# Patient Record
Sex: Female | Born: 1970 | Marital: Married | Smoking: Never smoker
Health system: Southern US, Community
[De-identification: ages and names within clinical notes are randomized; demographics above are authoritative.]

## PROBLEM LIST (undated history)

## (undated) DIAGNOSIS — Z9889 Other specified postprocedural states: Secondary | ICD-10-CM

## (undated) DIAGNOSIS — R87629 Unspecified abnormal cytological findings in specimens from vagina: Secondary | ICD-10-CM

## (undated) HISTORY — DX: Other specified postprocedural states: Z98.890

## (undated) HISTORY — DX: Unspecified abnormal cytological findings in specimens from vagina: R87.629

---

## 2019-12-06 ENCOUNTER — Encounter: Payer: Self-pay | Admitting: Obstetrics and Gynecology

## 2019-12-06 ENCOUNTER — Other Ambulatory Visit (HOSPITAL_COMMUNITY)
Admission: RE | Admit: 2019-12-06 | Discharge: 2019-12-06 | Disposition: A | Discharge status: Home / Self Care | Payer: 59 | Source: Ambulatory Visit | Attending: Obstetrics and Gynecology | Admitting: Obstetrics and Gynecology

## 2019-12-06 ENCOUNTER — Other Ambulatory Visit: Payer: Self-pay

## 2019-12-06 ENCOUNTER — Ambulatory Visit (INDEPENDENT_AMBULATORY_CARE_PROVIDER_SITE_OTHER): Payer: Self-pay | Admitting: Obstetrics and Gynecology

## 2019-12-06 VITALS — BP 109/55 | HR 66 | Ht 60.0 in | Wt 92.0 lb

## 2019-12-06 DIAGNOSIS — Z113 Encounter for screening for infections with a predominantly sexual mode of transmission: Secondary | ICD-10-CM

## 2019-12-06 DIAGNOSIS — R634 Abnormal weight loss: Secondary | ICD-10-CM

## 2019-12-06 DIAGNOSIS — Z1231 Encounter for screening mammogram for malignant neoplasm of breast: Secondary | ICD-10-CM

## 2019-12-06 DIAGNOSIS — Z124 Encounter for screening for malignant neoplasm of cervix: Secondary | ICD-10-CM | POA: Insufficient documentation

## 2019-12-06 DIAGNOSIS — Z01419 Encounter for gynecological examination (general) (routine) without abnormal findings: Secondary | ICD-10-CM

## 2019-12-06 DIAGNOSIS — R197 Diarrhea, unspecified: Secondary | ICD-10-CM

## 2019-12-06 NOTE — Progress Notes (Signed)
GYNECOLOGY ANNUAL PREVENTATIVE CARE ENCOUNTER NOTE  Subjective:   Mikayla Short is a 49 y.o. [phone] female here for a annual gynecologic exam. Current complaints: weight loss of 10 pounds unintentionally. She also has watery diarrhea and is very worried. Last Saw GYN in [place] and just wants a full check up.    Denies abnormal vaginal bleeding, discharge, pelvic pain, problems with intercourse or other gynecologic concerns. Accepts STI screen for swabs, does not want blood work.   Gynecologic History Patient's last menstrual period was 11/24/2019 (exact date). Contraception: perimenopausal Last Pap: many years ago, normal per pt Last mammogram: many years ago, normal per pt DEXA: has never had  Obstetric History OB History  Gravida Para Term Preterm AB Living  4 2 2  0 2 2  SAB TAB Ectopic Multiple Live Births  2 0 0 0 2    # Outcome Date GA Lbr Len/2nd Weight Sex Delivery Anes PTL Lv  4 SAB           3 SAB           2 Term      Vag-Spont     1 Term      CS-LTranv       Past Medical History:  Diagnosis Date  . Vaginal Pap smear, abnormal     History reviewed. No pertinent surgical history.  No current outpatient medications on file prior to visit.   No current facility-administered medications on file prior to visit.    No Known Allergies  Social History   Socioeconomic History  . Marital status: Married    Spouse name: Not on file  . Number of children: 2  . Years of education: Not on file  . Highest education level: Not on file  Occupational History  . Not on file  Tobacco Use  . Smoking status: Never Smoker  . Smokeless tobacco: Never Used  Vaping Use  . Vaping Use: Never used  Substance and Sexual Activity  . Alcohol use: Not Currently  . Drug use: Never  . Sexual activity: Yes  Other Topics Concern  . Not on file  Social History Narrative  . Not on file   Social Determinants of Health   Financial Resource Strain:   . Difficulty of  Paying Living Expenses: Not on file  Food Insecurity:   . Worried About in the Last Year: Not on file  . Ran Out of Food in the Last Year: Not on file  Transportation Needs:   . Lack of Transportation (Medical): Not on file  . Lack of Transportation (Non-Medical): Not on file  Physical Activity:   . Days of Exercise per Week: Not on file  . Minutes of Exercise per Session: Not on file  Stress:   . Feeling of Stress : Not on file  Social Connections:   . Frequency of Communication with Friends and Family: Not on file  . Frequency of Social Gatherings with Friends and Family: Not on file  . Attends Religious Services: Not on file  . Active Member of Clubs or Organizations: Not on file  . Attends Programme researcher, broadcasting/film/video Meetings: Not on file  . Marital Status: Not on file  Intimate Partner Violence:   . Fear of Current or Ex-Partner: Not on file  . Emotionally Abused: Not on file  . Physically Abused: Not on file  . Sexually Abused: Not on file    Family History  Problem Relation Age of  Onset  . Diabetes Mother   . Cancer Maternal Aunt   . Colon cancer Neg Hx   . Hypertension Neg Hx    The following portions of the patient's history were reviewed and updated as appropriate: allergies, current medications, past family history, past medical history, past social history, past surgical history and problem list.  Review of Systems Pertinent items are noted in HPI.   Objective:  BP (!) 109/55   Pulse 66   Ht 5' (1.524 m)   Wt 92 lb (41.7 kg)   LMP 11/24/2019 (Exact Date)   BMI 17.97 kg/m  CONSTITUTIONAL: Well-developed, well-nourished female in no acute distress.  HENT:  Normocephalic, atraumatic, External right and left ear normal. Oropharynx is clear and moist EYES: Conjunctivae and EOM are normal. Pupils are equal, round, and reactive to light. No scleral icterus.  NECK: Normal range of motion, supple, no masses.  Normal thyroid.  SKIN: Skin is warm and  dry. No rash noted. Not diaphoretic. No erythema. No pallor. NEUROLOGIC: Alert and oriented to person, place, and time. Normal reflexes, muscle tone coordination. No cranial nerve deficit noted. PSYCHIATRIC: Normal mood and affect. Normal behavior. Normal judgment and thought content. CARDIOVASCULAR: Normal heart rate noted RESPIRATORY: Effort normal, no problems with respiration noted. BREASTS: Symmetric in size. No masses, skin changes, nipple drainage, or lymphadenopathy. ABDOMEN: Soft, no distention noted.  No tenderness, rebound or guarding.  PELVIC: Normal appearing external genitalia; normal appearing vaginal mucosa and cervix.  No abnormal discharge noted.  Pap smear obtained. Pelvic cultures obtained. Normal uterine size, no other palpable masses, no uterine or adnexal tenderness. MUSCULOSKELETAL: Normal range of motion. No tenderness.  No cyanosis, clubbing, or edema.  2+ distal pulses.  Exam done with chaperone present.   Assessment and Plan:   1. Well woman exam Healthy female exam Reassured her regarding irregular periods, perimenopausal  2. Cervical cancer screening Pap today  3. Routine screening for STI (sexually transmitted infection) Gc/CT today Declines HIV/Hep B/C/RPR  4. Encounter for screening mammogram for malignant neoplasm of breast mammo ordered today  5. Weight loss Referral to GI  6. Diarrhea, unspecified type    Will follow up results of pap smear/STI screen and manage accordingly. Encouraged improvement in diet and exercise.  COVID vaccine UTD Accepts STI screen. Mammogram ordered today Referral for colonoscopy today  Routine preventative health maintenance measures emphasized. Please refer to After Visit Summary for other counseling recommendations.   Total face-to-face time with patient: 30 minutes. Over 50% of encounter was spent on counseling and coordination of care.   Baldemar Lenis, M.D. [facility]ending [facility] [employer]  Midwife)

## 2019-12-06 NOTE — Progress Notes (Signed)
Patient states she is worried that she has lost weight (10+ lbs)

## 2019-12-06 NOTE — Addendum Note (Signed)
Addended by: Leroy Libman on: 12/06/2019 10:43 AM   Modules accepted: Orders

## 2019-12-08 ENCOUNTER — Encounter: Payer: Self-pay | Admitting: Nurse Practitioner

## 2019-12-11 LAB — CYTOLOGY - PAP
Chlamydia: NEGATIVE
Comment: NEGATIVE
Comment: NEGATIVE
Comment: NORMAL
Diagnosis: NEGATIVE
High risk HPV: NEGATIVE
Neisseria Gonorrhea: NEGATIVE

## 2019-12-20 ENCOUNTER — Other Ambulatory Visit: Payer: Self-pay | Admitting: Gastroenterology

## 2019-12-20 DIAGNOSIS — R634 Abnormal weight loss: Secondary | ICD-10-CM

## 2019-12-20 DIAGNOSIS — R1011 Right upper quadrant pain: Secondary | ICD-10-CM

## 2019-12-21 ENCOUNTER — Ambulatory Visit: Payer: 59 | Admitting: Physician Assistant

## 2019-12-26 ENCOUNTER — Ambulatory Visit: Payer: 59 | Admitting: Nurse Practitioner

## 2020-01-08 ENCOUNTER — Other Ambulatory Visit: Payer: Self-pay | Admitting: Physician Assistant

## 2020-01-08 DIAGNOSIS — Z0001 Encounter for general adult medical examination with abnormal findings: Secondary | ICD-10-CM

## 2020-01-17 ENCOUNTER — Ambulatory Visit: Payer: Self-pay

## 2020-01-19 ENCOUNTER — Ambulatory Visit
Admission: RE | Admit: 2020-01-19 | Discharge: 2020-01-19 | Disposition: A | Discharge status: Home / Self Care | Payer: 59 | Source: Ambulatory Visit | Attending: Gastroenterology | Admitting: Gastroenterology

## 2020-01-19 ENCOUNTER — Other Ambulatory Visit: Payer: Self-pay

## 2020-01-19 DIAGNOSIS — R634 Abnormal weight loss: Secondary | ICD-10-CM

## 2020-01-19 DIAGNOSIS — R1011 Right upper quadrant pain: Secondary | ICD-10-CM

## 2020-01-19 MED ORDER — IOPAMIDOL (ISOVUE-300) INJECTION 61%
100.0000 mL | Freq: Once | INTRAVENOUS | Status: AC | PRN
  Administered 2020-01-19: 09:00:00 100 mL via INTRAVENOUS

## 2020-03-01 ENCOUNTER — Other Ambulatory Visit: Payer: Self-pay

## 2020-03-01 ENCOUNTER — Ambulatory Visit
Admission: RE | Admit: 2020-03-01 | Discharge: 2020-03-01 | Disposition: A | Discharge status: Home / Self Care | Payer: 59 | Source: Ambulatory Visit | Attending: Obstetrics and Gynecology | Admitting: Obstetrics and Gynecology

## 2020-03-01 DIAGNOSIS — Z1231 Encounter for screening mammogram for malignant neoplasm of breast: Secondary | ICD-10-CM

## 2020-04-19 ENCOUNTER — Other Ambulatory Visit: Payer: 59

## 2020-09-24 ENCOUNTER — Other Ambulatory Visit: Payer: Self-pay | Admitting: Physician Assistant

## 2020-09-24 DIAGNOSIS — Z0001 Encounter for general adult medical examination with abnormal findings: Secondary | ICD-10-CM

## 2020-09-25 ENCOUNTER — Other Ambulatory Visit: Payer: 59

## 2021-03-14 ENCOUNTER — Other Ambulatory Visit: Payer: 59

## 2021-05-05 ENCOUNTER — Other Ambulatory Visit: Payer: Self-pay

## 2021-05-05 ENCOUNTER — Other Ambulatory Visit: Payer: Self-pay | Admitting: Physician Assistant

## 2021-05-05 DIAGNOSIS — Z0001 Encounter for general adult medical examination with abnormal findings: Secondary | ICD-10-CM

## 2021-05-05 DIAGNOSIS — Z1231 Encounter for screening mammogram for malignant neoplasm of breast: Secondary | ICD-10-CM

## 2021-05-15 ENCOUNTER — Ambulatory Visit: Payer: Self-pay

## 2021-06-06 ENCOUNTER — Ambulatory Visit: Payer: Self-pay | Admitting: Family Medicine

## 2021-06-20 ENCOUNTER — Ambulatory Visit: Payer: Self-pay | Admitting: Family

## 2021-06-27 ENCOUNTER — Ambulatory Visit (INDEPENDENT_AMBULATORY_CARE_PROVIDER_SITE_OTHER): Payer: 59 | Admitting: Family

## 2021-06-27 ENCOUNTER — Telehealth: Payer: Self-pay | Admitting: Family

## 2021-06-27 VITALS — BP 100/60 | HR 71 | Temp 97.8°F | Resp 16 | Ht 60.0 in | Wt 100.4 lb

## 2021-06-27 DIAGNOSIS — Z1231 Encounter for screening mammogram for malignant neoplasm of breast: Secondary | ICD-10-CM

## 2021-06-27 DIAGNOSIS — E559 Vitamin D deficiency, unspecified: Secondary | ICD-10-CM | POA: Diagnosis not present

## 2021-06-27 DIAGNOSIS — R197 Diarrhea, unspecified: Secondary | ICD-10-CM

## 2021-06-27 NOTE — Telephone Encounter (Signed)
Please reach out to Dr. Darliss Ridgel to get copies of colonoscopy/ last labs;

## 2021-06-27 NOTE — Patient Instructions (Addendum)
For your Vitamin D, please take 2000 IU Vitamin D3 daily;    Dyshidrotic Eczema Dyshidrotic eczema, also known as pompholyx, is a type of eczema that causes very itchy, fluid-filled blisters (vesicles) to form on the hands and feet. It is more common before age 52, though it can affect people of any age. There is no cure, but treatment and certain lifestyle changes can help relieve symptoms. What are the causes? The cause of this condition is not known. What increases the risk? You are more likely to develop this condition if: You wash your hands frequently. You have a personal or family history of eczema, allergies, asthma, or hay fever. You are allergic to metals, such as nickel or cobalt. You work with cement. You smoke. What are the signs or symptoms? Symptoms of this condition may affect the hands, the feet, or both. Symptoms may come and go (recur), and may include: Severe itching. This may happen before blisters appear. Blisters. These may form suddenly. In the early stages, blisters may form near the fingertips. In severe cases, blisters may grow to large blister masses (bullae). Blisters resolve in 2-3 weeks without bursting. This is followed by a dry phase in which itching eases. Pain and swelling. Cracks or long, narrow openings (fissures) in the skin. Severe dryness. Ridges on the nails. How is this diagnosed? This condition may be diagnosed based on: Your symptoms and a physical exam. Your medical history. Skin scrapings to rule out a fungal infection. Testing a swab of fluid for bacteria (culture). Removing a small piece of skin (biopsy) to test for infection or to rule out other conditions. Skin patch tests. These tests involve using patches that contain possible allergens and placing them on your back. Your health care provider will wait a few days and then check to see if an allergic reaction occurred. These tests may be done if your health care provider suspects  allergic reactions, or to rule out other types of eczema. You may be referred to a health care provider who specializes in skin conditions (dermatologist) to help diagnose and treat this condition. How is this treated? There is no cure for this condition, but treatment can help relieve symptoms. Depending on the amount and severity of the blisters, your health care provider may suggest: Avoiding allergens, irritants, or triggers that worsen symptoms. This may involve lifestyle changes, such as: Using different lotions or soaps. Avoiding hot weather or places that will cause you to sweat a lot. Managing stress with coping techniques, such as relaxation and exercise, and asking for help when you need it. Diet changes as recommended by your health care provider. Using a clean, damp towel (cool compress) to relieve symptoms. Soaking in a bath that contains a type of salt that relieves irritation (aluminum acetate soaks). Medicines, such as: Medicine taken by mouth to reduce itching (oral antihistamines). Medicine applied to the skin to reduce swelling and irritation (topical corticosteroids). Medicine that reduces the activity of the body's disease-fighting system (immunosuppressants) to treat inflammation. This may be given in severe cases. Antibiotic medicines to treat bacterial infection. Light therapy (phototherapy). This involves shining ultraviolet (UV) light on the affected skin in order to reduce itchiness and inflammation. Follow these instructions at home: Bathing and skin care  Wash skin gently. After bathing or washing your hands, pat your skin dry. Avoid rubbing your skin. Remove all jewelry before bathing. If the skin under the jewelry stays wet, blisters may form or get worse. Apply cool compresses as  told by your health care provider. To do this: Soak a clean towel in cool water. Wring out excess water until towel is damp. Place the towel over the affected skin. Leave the towel  on for 20 minutes at a time, 2-3 times a day. Use mild soaps, cleansers, and lotions that do not contain dyes, perfumes, or other irritants. Keep your skin hydrated. To do this: Avoid very hot water. Take lukewarm baths or showers. Apply moisturizer within 3 minutes of bathing. This locks in moisture. Medicines Take and apply over-the-counter and prescription medicines only as told by your health care provider. If you were prescribed an antibiotic medicine, take or apply it as told by your health care provider. Do not stop using the antibiotic even if you start to feel better. General instructions Do not use any products that contain nicotine or tobacco. These include cigarettes, chewing tobacco, and vaping devices, such as e-cigarettes. If you need help quitting, ask your health care provider. Identify and avoid triggers and allergens. Keep fingernails short to avoid breaking the skin while scratching. Use waterproof gloves to protect your hands when doing work that keeps your hands wet for a long time. Wear socks to keep your feet dry. Keep all follow-up visits. This is important. Contact a health care provider if: You have symptoms that do not go away. You have signs of infection, such as: Crusting, pus, or a bad smell. More redness, swelling, or pain. Increased warmth in the affected area. Get help right away if: Your skin gets streaking redness with associated pain. Summary Dyshidrotic eczema, also known as pompholyx, is a type of eczema that causes very itchy, fluid-filled blisters (vesicles) to form on the hands and feet. The cause of this condition is not known. There is no cure for this condition, but treatment can help relieve symptoms. Treatment depends on the amount and severity of the blisters. Use mild soaps, cleansers, and lotions that do not contain dyes, perfumes, or other irritants. Keep your skin hydrated. This information is not intended to replace advice given to you  by your health care provider. Make sure you discuss any questions you have with your health care provider. Document Revised: 10/30/2019 Document Reviewed: 10/30/2019 Elsevier Patient Education  [place].

## 2021-06-27 NOTE — Progress Notes (Signed)
  Mikayla Short is a 51 y.o. female with the following history as recorded in EpicCare:  There are no problems to display for this patient.   Current Outpatient Medications  Medication Sig Dispense Refill   doxycycline (VIBRA-TABS) 100 MG tablet Take 100 mg by mouth 2 (two) times daily.     naproxen (NAPROSYN) 500 MG tablet Take 500 mg by mouth 2 (two) times daily.     triamcinolone ointment (KENALOG) 0.1 % Apply topically.     No current facility-administered medications for this visit.    Allergies: Patient has no known allergies.  Past Medical History:  Diagnosis Date   Vaginal Pap smear, abnormal     No past surgical history on file.  Family History  Problem Relation Age of Onset   Diabetes Mother    Cancer Maternal Aunt    Colon cancer Neg Hx    Hypertension Neg Hx     Social History   Tobacco Use   Smoking status: Never   Smokeless tobacco: Never  Substance Use Topics   Alcohol use: Not Currently    Subjective:  Patient had CPE with another provider last month; however, she wants to establish care at this practice and asks if lab results can be reviewed; Needs mammogram order updated; Mentions intermittent "stomach issues"- has seen Dr. Loreta Ave in the past; had abd/ pelvic CT in 2021 which was essentially normal; per patient, has had colonoscopy;     Objective:  Vitals:   06/27/21 1416  BP: 100/60  Pulse: 71  Resp: 16  Temp: 97.8 F (36.6 C)  TempSrc: Oral  SpO2: 98%  Weight: 100 lb 6.4 oz (45.5 kg)  Height: 5' (1.524 m)    General: Well developed, well nourished, in no acute distress  Skin : Warm and dry.  Head: Normocephalic and atraumatic  Eyes: Sclera and conjunctiva clear; pupils round and reactive to light; extraocular movements intact  Ears: External normal; canals clear; tympanic membranes normal  Oropharynx: Pink, supple. No suspicious lesions  Neck: Supple without thyromegaly, adenopathy  Lungs: Respirations unlabored; clear to auscultation  bilaterally without wheeze, rales, rhonchi  CVS exam: normal rate and regular rhythm.  Abdomen: Soft; nontender; nondistended; normoactive bowel sounds; no masses or hepatosplenomegaly  Neurologic: Alert and oriented; speech intact; face symmetrical; moves all extremities well; CNII-XII intact without focal deficit   Assessment:  1. Vitamin D deficiency   2. Screening mammogram for breast cancer   3. Diarrhea, unspecified type     Plan:  Reviewed recent labs with patient from her CPE last month; reassurance that labs look very good except Vitamin D level is down slightly; she will take 2000 IU Vitamin D3 daily; Order updated; ? Chronic issue- check amylase, lipase; will request records from her GI;   No follow-ups on file.  Orders Placed This Encounter  Procedures   MM Digital Screening    Standing Status:   Future    Standing Expiration Date:   06/28/2022    Order Specific Question:   Reason for Exam (SYMPTOM  OR DIAGNOSIS REQUIRED)    Answer:   screening mammogram    Order Specific Question:   Is the patient pregnant?    Answer:   No    Order Specific Question:   Preferred imaging location?    Answer:   [facility]   Amylase   Lipase    Requested Prescriptions    No prescriptions requested or ordered in this encounter

## 2021-06-28 LAB — LIPASE: Lipase: 51 U/L (ref 7–60)

## 2021-06-28 LAB — AMYLASE: Amylase: 66 U/L (ref 21–101)

## 2021-07-25 ENCOUNTER — Ambulatory Visit: Payer: Self-pay

## 2021-08-02 IMAGING — MG DIGITAL SCREENING BILAT W/ CAD
4 series · 4 of 4 positions shown · non-contrast
Comparison: None.

CLINICAL DATA: Screening.

EXAM:
DIGITAL SCREENING BILATERAL MAMMOGRAM WITH CAD

[L CC]
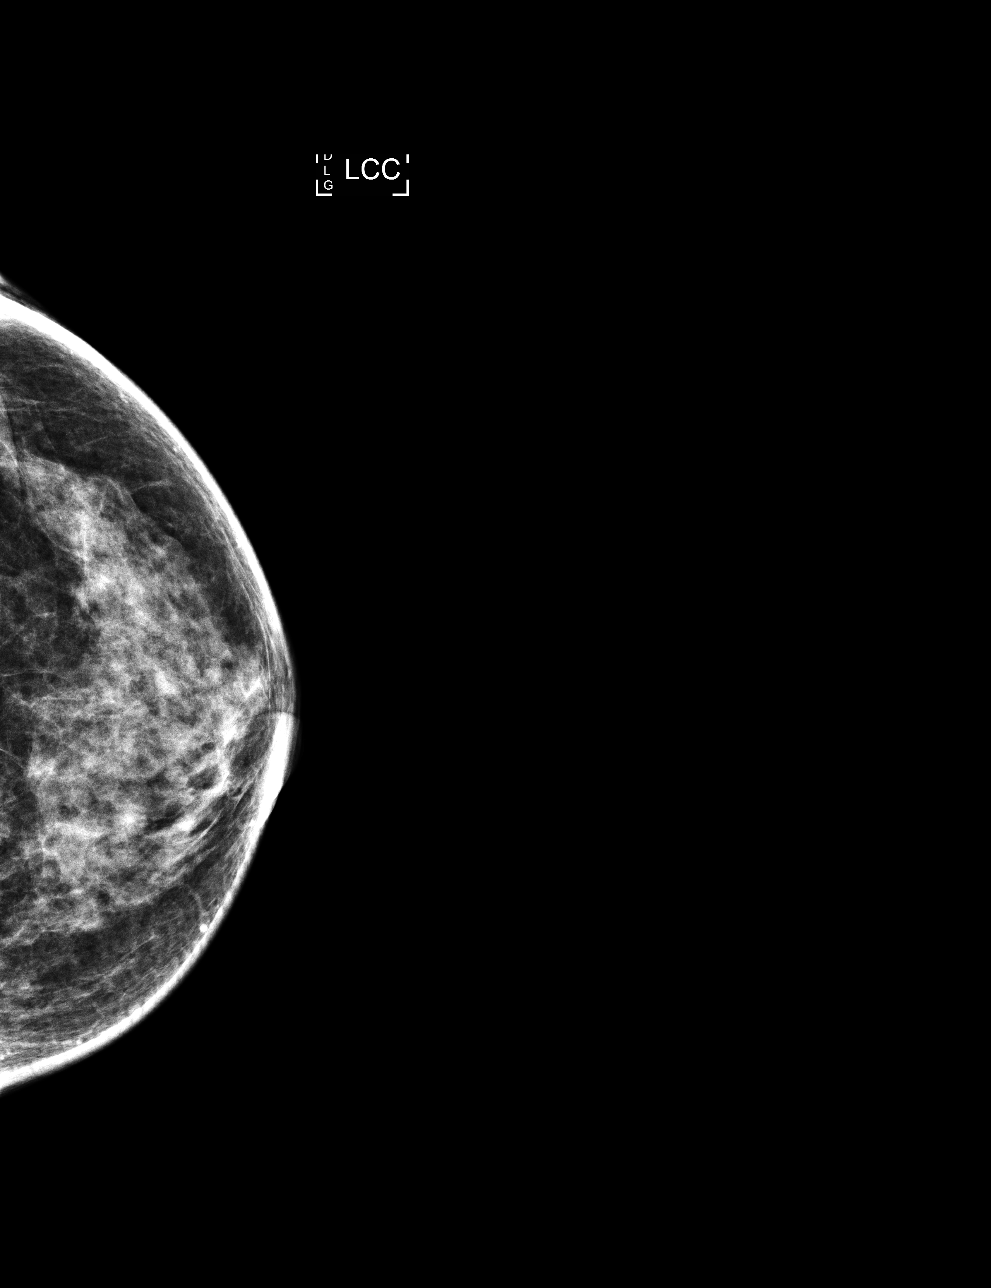

[R CC]
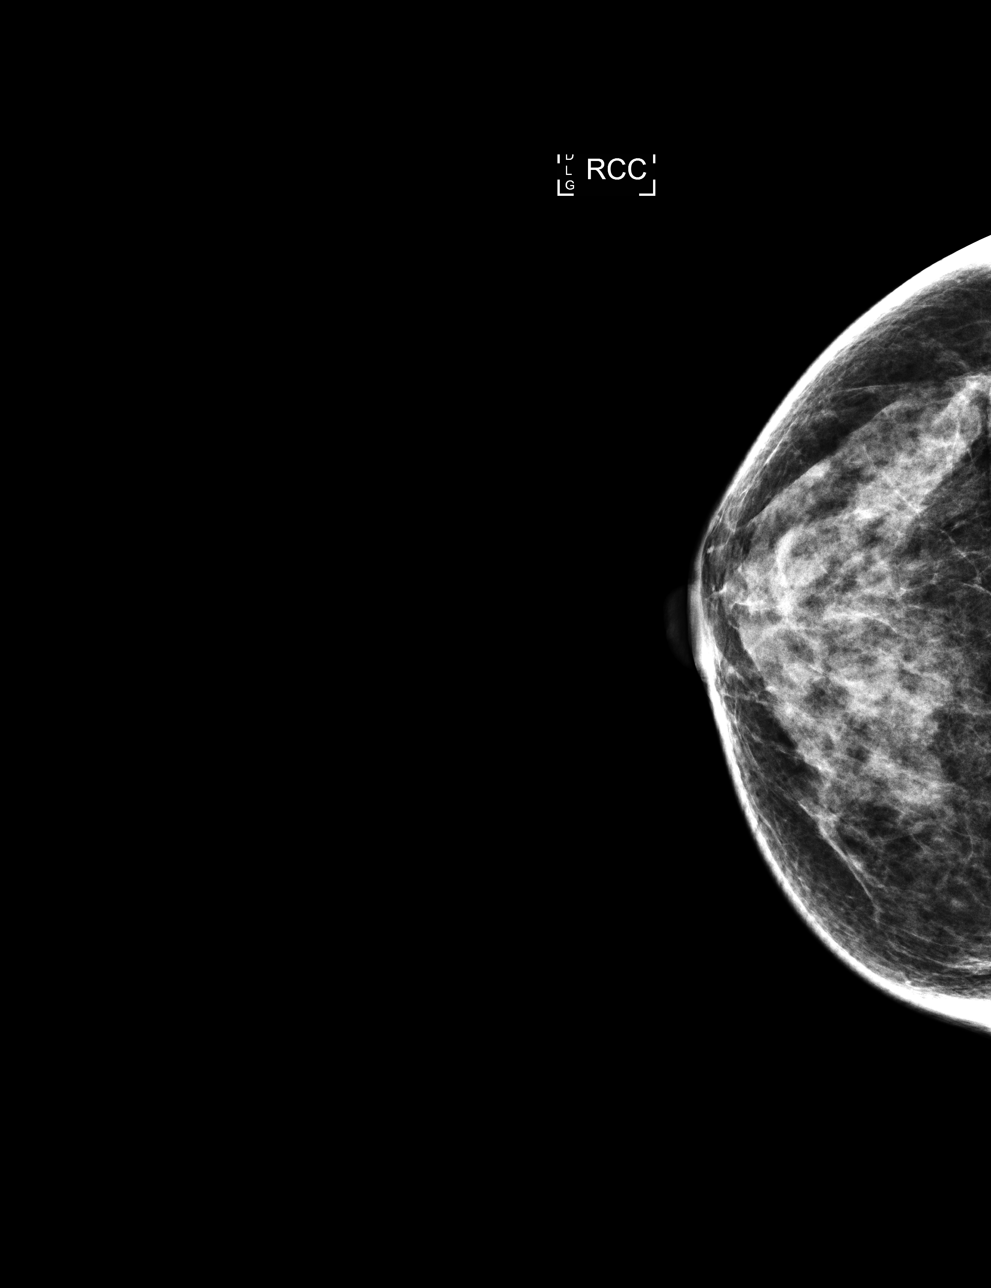

[R MLO]
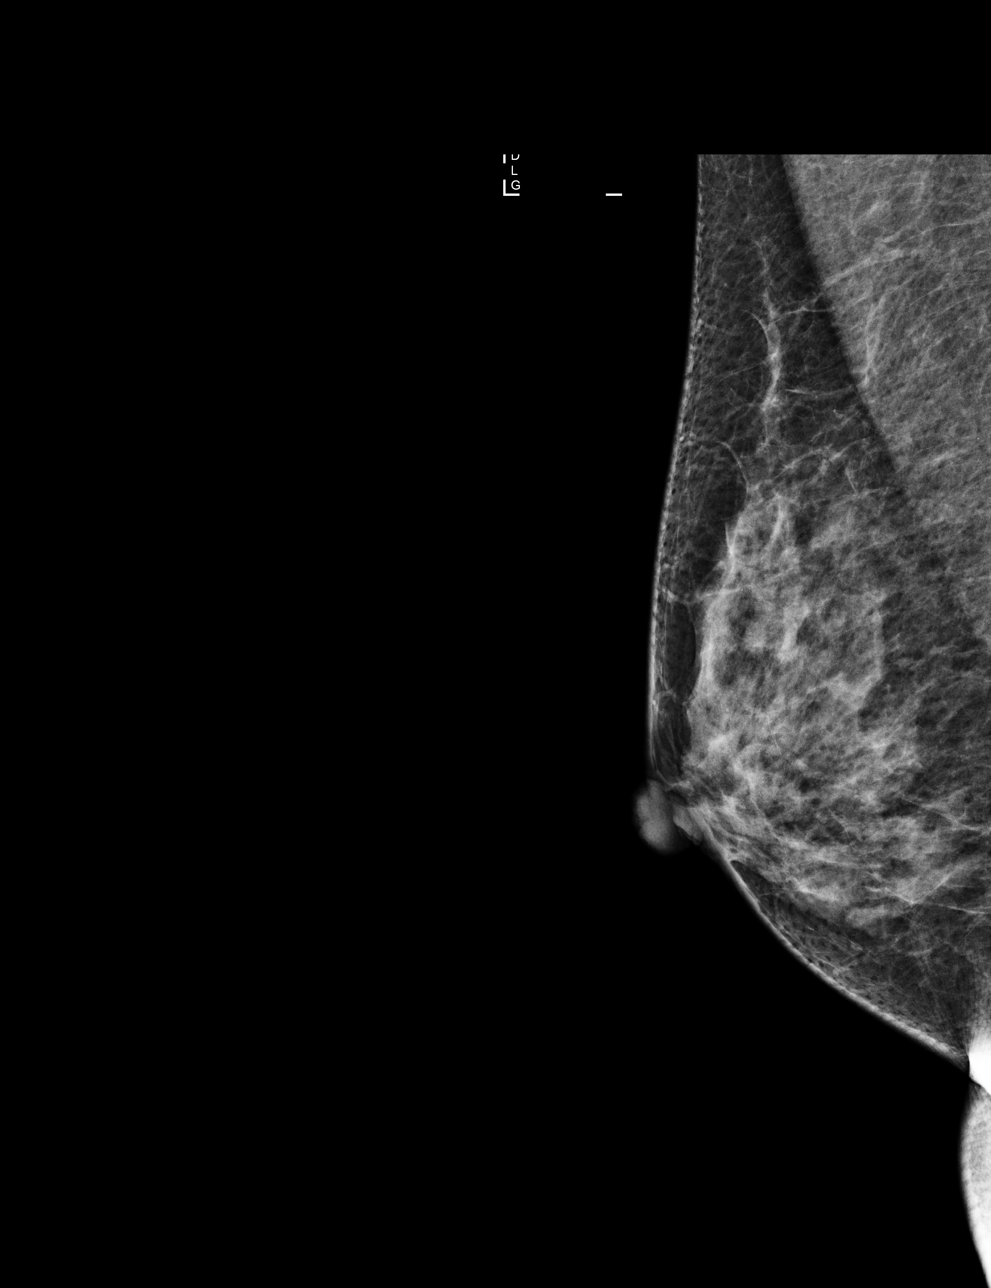

[L MLO]
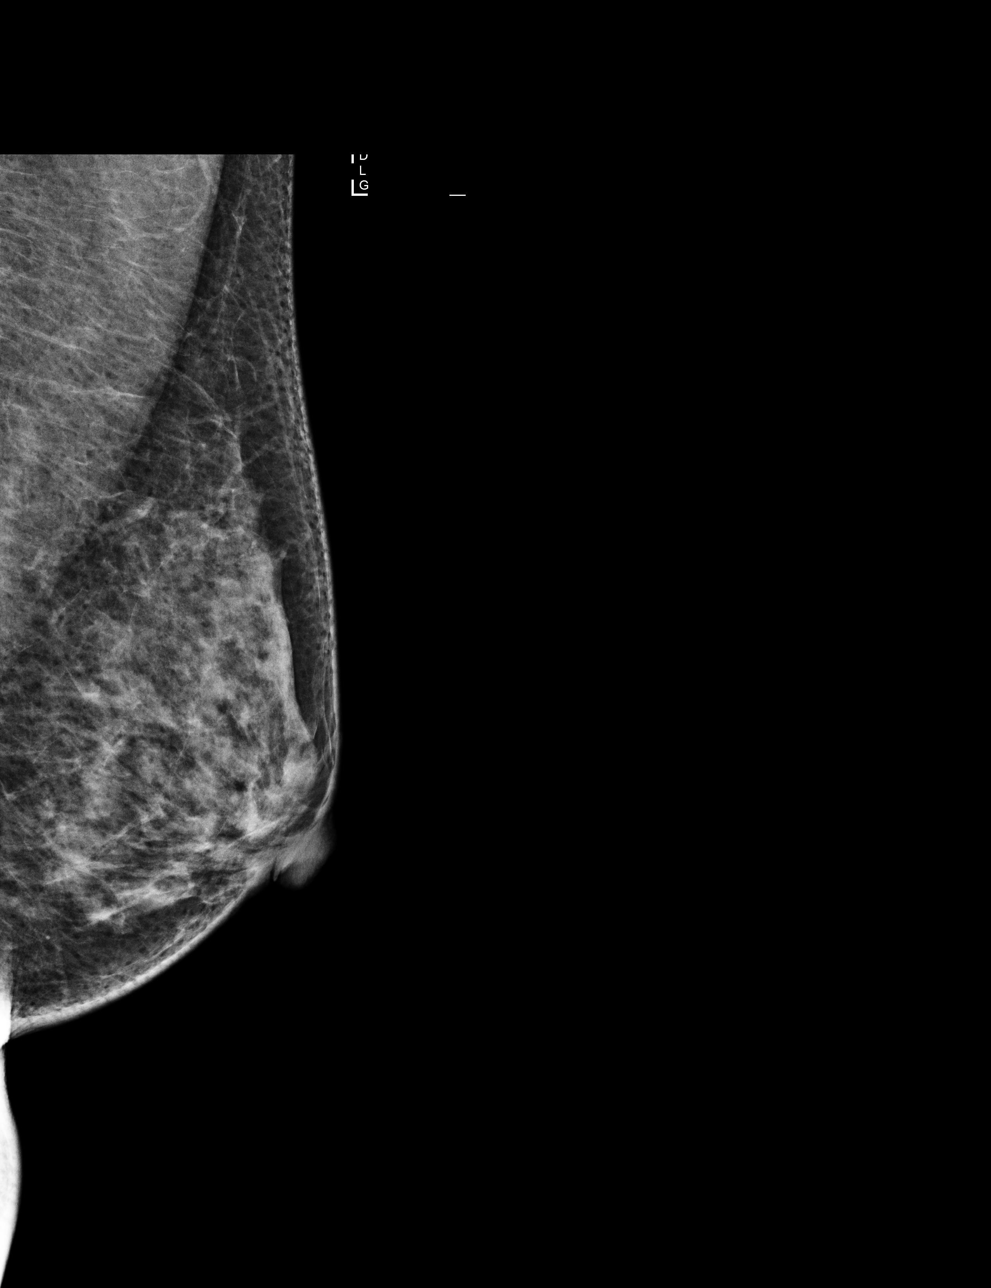

[4 of 4 positions shown; findings below may reference images not displayed]

ACR Breast Density Category c: The breast tissue is heterogeneously
dense, which may obscure small masses
FINDINGS: There are no findings suspicious for malignancy. The images were
evaluated with computer-aided detection.
IMPRESSION: No mammographic evidence of malignancy. A result letter of this
screening mammogram will be mailed directly to the patient.

RECOMMENDATION:
Screening mammogram in one year. (Code:7F-O-QA2)

BI-RADS CATEGORY  1: Negative.

## 2021-10-24 ENCOUNTER — Other Ambulatory Visit: Payer: Self-pay

## 2022-09-04 ENCOUNTER — Ambulatory Visit (INDEPENDENT_AMBULATORY_CARE_PROVIDER_SITE_OTHER): Payer: 59 | Admitting: Family

## 2022-09-04 ENCOUNTER — Encounter: Payer: Self-pay | Admitting: Family

## 2022-09-04 ENCOUNTER — Other Ambulatory Visit (HOSPITAL_COMMUNITY)
Admission: RE | Admit: 2022-09-04 | Discharge: 2022-09-04 | Disposition: A | Discharge status: Home / Self Care | Payer: 59 | Source: Ambulatory Visit | Attending: Family | Admitting: Family

## 2022-09-04 VITALS — BP 98/62 | HR 70 | Ht 60.0 in | Wt 103.0 lb

## 2022-09-04 DIAGNOSIS — Z13220 Encounter for screening for lipoid disorders: Secondary | ICD-10-CM | POA: Diagnosis not present

## 2022-09-04 DIAGNOSIS — Z124 Encounter for screening for malignant neoplasm of cervix: Secondary | ICD-10-CM | POA: Diagnosis not present

## 2022-09-04 DIAGNOSIS — Z0000 Encounter for general adult medical examination without abnormal findings: Secondary | ICD-10-CM

## 2022-09-04 DIAGNOSIS — Z1231 Encounter for screening mammogram for malignant neoplasm of breast: Secondary | ICD-10-CM

## 2022-09-04 DIAGNOSIS — L290 Pruritus ani: Secondary | ICD-10-CM

## 2022-09-04 DIAGNOSIS — E559 Vitamin D deficiency, unspecified: Secondary | ICD-10-CM | POA: Diagnosis not present

## 2022-09-04 LAB — CBC WITH DIFFERENTIAL/PLATELET
Absolute Monocytes: 391 cells/uL (ref 200–950)
Basophils Absolute: 37 cells/uL (ref 0–200)
Basophils Relative: 0.6 %
Eosinophils Absolute: 99 cells/uL (ref 15–500)
Eosinophils Relative: 1.6 %
HCT: 38.4 % (ref 35.0–45.0)
Hemoglobin: 12.6 g/dL (ref 11.7–15.5)
Lymphs Abs: 2548 cells/uL (ref 850–3900)
MCH: 29.7 pg (ref 27.0–33.0)
MCHC: 32.8 g/dL (ref 32.0–36.0)
MCV: 90.6 fL (ref 80.0–100.0)
MPV: 11.3 fL (ref 7.5–12.5)
Monocytes Relative: 6.3 %
Neutro Abs: 3125 cells/uL (ref 1500–7800)
Neutrophils Relative %: 50.4 %
Platelets: 226 10*3/uL (ref 140–400)
RBC: 4.24 10*6/uL (ref 3.80–5.10)
RDW: 12.4 % (ref 11.0–15.0)
Total Lymphocyte: 41.1 %
WBC: 6.2 10*3/uL (ref 3.8–10.8)

## 2022-09-04 MED ORDER — HYDROCORTISONE (PERIANAL) 2.5 % EX CREA: 1.0000 | TOPICAL_CREAM | Freq: Two times a day (BID) | CUTANEOUS | 0 refills | Status: AC

## 2022-09-04 NOTE — Progress Notes (Signed)
Mikayla Short is a 52 y.o. female with the following history as recorded in EpicCare:  There are no problems to display for this patient.   Current Outpatient Medications  Medication Sig Dispense Refill   hydrocortisone (ANUSOL-HC) 2.5 % rectal cream Place 1 Application rectally 2 (two) times daily. 30 g 0   No current facility-administered medications for this visit.    Allergies: Patient has no known allergies.  Past Medical History:  Diagnosis Date   Vaginal Pap smear, abnormal     No past surgical history on file.  Family History  Problem Relation Age of Onset   Diabetes Mother    Cancer Maternal Aunt    Colon cancer Neg Hx    Hypertension Neg Hx     Social History   Tobacco Use   Smoking status: Never   Smokeless tobacco: Never  Substance Use Topics   Alcohol use: Not Currently    Subjective:   Presents for yearly CPE; per patient, she is up to date on her colonoscopy; Pap smear was done in 2021 per records;  Overdue for mammogram;   Patient is also complaining of 3-4 month history of rectal itching/ rectal bleeding; has not taken any OTC medication/ no known history of hemorrhoids;   Review of Systems  Constitutional: Negative.   HENT: Negative.    Eyes: Negative.   Respiratory: Negative.    Cardiovascular: Negative.   Gastrointestinal: Negative.   Genitourinary: Negative.   Musculoskeletal: Negative.   Skin:  Positive for itching.  Neurological: Negative.   Endo/Heme/Allergies: Negative.   Psychiatric/Behavioral: Negative.        Objective:  Vitals:   09/04/22 1333  BP: 98/62  Pulse: 70  SpO2: 99%  Weight: 103 lb (46.7 kg)  Height: 5' (1.524 m)    General: Well developed, well nourished, in no acute distress  Skin : Warm and dry.  Head: Normocephalic and atraumatic  Eyes: Sclera and conjunctiva clear; pupils round and reactive to light; extraocular movements intact  Ears: External normal; canals clear; tympanic membranes normal  Oropharynx:  Pink, supple. No suspicious lesions  Neck: Supple without thyromegaly, adenopathy  Lungs: Respirations unlabored; clear to auscultation bilaterally without wheeze, rales, rhonchi  CVS exam: normal rate and regular rhythm.  Abdomen: Soft; nontender; nondistended; normoactive bowel sounds; no masses or hepatosplenomegaly  Musculoskeletal: No deformities; no active joint inflammation  Extremities: No edema, cyanosis, clubbing  Vessels: Symmetric bilaterally  Neurologic: Alert and oriented; speech intact; face symmetrical; moves all extremities well; CNII-XII intact without focal deficit   Assessment:  1. PE (physical exam), annual   2. Rectal itching   3. Visit for screening mammogram   4. Cervical cancer screening   5. Lipid screening   6. Vitamin D deficiency     Plan:  Age appropriate preventive healthcare needs addressed; encouraged regular eye doctor and dental exams; encouraged regular exercise; will update labs and refills as needed today; follow-up in 1 year, sooner prn. Thin Prep pap collected; trial of Anusol HC for rectal itching- she will follow up with her GI if symptoms persist;   No follow-ups on file.  Orders Placed This Encounter  Procedures   MM Digital Screening    Standing Status:   Future    Standing Expiration Date:   09/04/2023    Order Specific Question:   Reason for Exam (SYMPTOM  OR DIAGNOSIS REQUIRED)    Answer:   screening mammogram    Order Specific Question:   Is the patient pregnant?  Answer:   No    Order Specific Question:   Preferred imaging location?    Answer:   [facility]   CBC with Differential/Platelet   Comp Met (CMET)   Lipid panel   TSH   Vitamin D (25 hydroxy)   Ambulatory referral to Gastroenterology    Referral Priority:   Routine    Referral Type:   Consultation    Referral Reason:   Specialty Services Required    Number of Visits Requested:   1    Requested Prescriptions   Signed Prescriptions Disp Refills    hydrocortisone (ANUSOL-HC) 2.5 % rectal cream 30 g 0    Sig: Place 1 Application rectally 2 (two) times daily.

## 2022-10-01 ENCOUNTER — Ambulatory Visit (HOSPITAL_BASED_OUTPATIENT_CLINIC_OR_DEPARTMENT_OTHER)
Admission: RE | Admit: 2022-10-01 | Discharge: 2022-10-01 | Disposition: A | Discharge status: Home / Self Care | Payer: 59 | Source: Ambulatory Visit | Attending: Family | Admitting: Family

## 2022-10-01 ENCOUNTER — Encounter (HOSPITAL_BASED_OUTPATIENT_CLINIC_OR_DEPARTMENT_OTHER): Payer: Self-pay

## 2022-10-01 DIAGNOSIS — Z1231 Encounter for screening mammogram for malignant neoplasm of breast: Secondary | ICD-10-CM | POA: Diagnosis not present

## 2022-11-09 ENCOUNTER — Encounter: Payer: Self-pay | Admitting: Family

## 2022-12-04 ENCOUNTER — Encounter: Payer: Self-pay | Admitting: Gastroenterology

## 2023-03-09 ENCOUNTER — Ambulatory Visit (INDEPENDENT_AMBULATORY_CARE_PROVIDER_SITE_OTHER): Payer: Self-pay | Admitting: Gastroenterology

## 2023-03-09 ENCOUNTER — Encounter: Payer: Self-pay | Admitting: Gastroenterology

## 2023-03-09 VITALS — BP 94/60 | HR 76 | Ht <= 58 in | Wt 100.5 lb

## 2023-03-09 DIAGNOSIS — K648 Other hemorrhoids: Secondary | ICD-10-CM | POA: Diagnosis not present

## 2023-03-09 DIAGNOSIS — R09A2 Foreign body sensation, throat: Secondary | ICD-10-CM | POA: Diagnosis not present

## 2023-03-09 DIAGNOSIS — L290 Pruritus ani: Secondary | ICD-10-CM | POA: Diagnosis not present

## 2023-03-09 DIAGNOSIS — R195 Other fecal abnormalities: Secondary | ICD-10-CM

## 2023-03-09 DIAGNOSIS — K641 Second degree hemorrhoids: Secondary | ICD-10-CM

## 2023-03-09 NOTE — Patient Instructions (Addendum)
 You have been scheduled for a Barium Esophogram at [facility] Radiology (1st floor of the hospital) on 03/22/23 at 10:00 am. Please arrive 30 minutes prior to your appointment for registration. Make certain not to have anything to eat or drink 3 hours prior to your test. If you need to reschedule for any reason, please contact radiology at [phone] to do so. __________________________________________________________________ A barium swallow is an examination that concentrates on views of the esophagus. This tends to be a double contrast exam (barium and two liquids which, when combined, create a gas to distend the wall of the oesophagus) or single contrast (non-ionic iodine based). The study is usually tailored to your symptoms so a good history is essential. Attention is paid during the study to the form, structure and configuration of the esophagus, looking for functional disorders (such as aspiration, dysphagia, achalasia, motility and reflux) EXAMINATION You may be asked to change into a gown, depending on the type of swallow being performed. A radiologist and radiographer will perform the procedure. The radiologist will advise you of the type of contrast selected for your procedure and direct you during the exam. You will be asked to stand, sit or lie in several different positions and to hold a small amount of fluid in your mouth before being asked to swallow while the imaging is performed .In some instances you may be asked to swallow barium coated marshmallows to assess the motility of a solid food bolus. The exam can be recorded as a digital or video fluoroscopy procedure. POST PROCEDURE It will take 1-2 days for the barium to pass through your system. To facilitate this, it is important, unless otherwise directed, to increase your fluids for the next 24-48hrs and to resume your normal diet.  This test typically takes about 30 minutes to  perform. __________________________________________________________________________________  Please purchase the following medications over the counter and take as directed:  Fiber Supplement -A high fiber diet with plenty of fluids (up to 8 glasses of water daily) is suggested to relieve these symptoms.  Metamucil, 1 tablespoon once or twice daily can be used to keep bowels regular if needed.   Desitin -use as directed   Follow-up on : 04/16/23 at 10:20 am for Hemorrhoid Banding #1 with Dr San   _______________________________________________________  If your blood pressure at your visit was 140/90 or greater, please contact your primary care physician to follow up on this.  _______________________________________________________  If you are age 71 or older, your body mass index should be between 23-30. Your Body mass index is 21 kg/m. If this is out of the aforementioned range listed, please consider follow up with your Primary Care Provider.  If you are age 78 or younger, your body mass index should be between 19-25. Your Body mass index is 21 kg/m. If this is out of the aformentioned range listed, please consider follow up with your Primary Care Provider.   ________________________________________________________  The [place] [facility] providers would like to encourage you to use MYCHART to communicate with providers for non-urgent requests or questions.  Due to long hold times on the telephone, sending your provider a message by [facility] may be a faster and more efficient way to get a response.  Please allow 48 business hours for a response.  Please remember that this is for non-urgent requests.  _______________________________________________________ Due to recent changes in healthcare laws, you may see the results of your imaging and laboratory studies on MyChart before your provider has had a chance to  review them.  We understand that in some cases there may be results that are  confusing or concerning to you. Not all laboratory results come back in the same time frame and the provider may be waiting for multiple results in order to interpret others.  Please give us  48 hours in order for your provider to thoroughly review all the results before contacting the office for clarification of your results.   It was a pleasure to see you today!  Vito Cirigliano, D.O.

## 2023-03-09 NOTE — Progress Notes (Signed)
 Chief Complaint: Anal itching, abdominal discomfort, globus sensation   Referring Provider:     Jason Leita Repine, FNP    HPI:     Mikayla Short is a 53 y.o. female referred to the Gastroenterology Clinic for evaluation of rectal itching, abdominal discomfort, globus sensation.  Has been having rectal itching for the last 8 months or so.  Was seen by her PCM for this issue and prescribed hydrocortisone  cream x2 weeks with improvement.  Symptoms recurred after completion of the hydrocortisone  course.  She has seen scant BRB on tissue paper at times, but she attributes that to times of increased pruritus ani and excoriation.  No change in bowel habits otherwise.  Stools tend to be loose, and has been that way for years. 1-2 BM/day. Sxs actually improve when traveling or eating out at restaurants.   Separately, feeling that something is moving around in her lower abdomen.  Not pain per se.  No fever, chills.  No nausea/vomiting.  Weight stable.  Finally, feeling that something is stuck in her neck when swallowing.  Only occurs when head tilted to left and swallowing. Not when in neutral or right flexion. No odynophagia. No heartburn, regurgitation. Present for 1 year. Had neck surgery ~18 years ago in [place] for removal of what she reports was a benign growth.  Endoscopic Hx: - 12/27/2019: EGD (Dr. Kristie): Esophagitis, otherwise normal.  Prescribed protonix 40 mg daily - 12/27/2019: Colonoscopy (Dr. Kristie): Diverticulosis, internal hemorrhoids. Norml TI. Repeat 10 years    Reviewed most recent labs from 09/2022: Vitamin D  29, otherwise normal CBC, CMP, TSH.  Did have a CT abdomen/pelvis in 01/2020 which was normal and no acute intra-abdominal pathology.  Past Medical History:  Diagnosis Date  . H/O neck surgery    to remove a lump on left side of neck  . Vaginal Pap smear, abnormal      Past Surgical History:  Procedure Laterality Date  . CESAREAN SECTION      Family History  Problem Relation Age of Onset  . Diabetes Mother   . Heart disease Father   . Hyperlipidemia Sister   . Cancer Maternal Aunt   . Asthma Daughter   . Asthma Son   . Colon cancer Neg Hx   . Hypertension Neg Hx    Social History   Tobacco Use  . Smoking status: Never  . Smokeless tobacco: Never  Vaping Use  . Vaping status: Never Used  Substance Use Topics  . Alcohol use: Not Currently  . Drug use: Never   Current Outpatient Medications  Medication Sig Dispense Refill  . Cholecalciferol (VITAMIN D -3 PO) Take 1 capsule by mouth daily.    . hydrocortisone  (ANUSOL -HC) 2.5 % rectal cream Place 1 Application rectally 2 (two) times daily. 30 g 0   No current facility-administered medications for this visit.   No Known Allergies   Review of Systems: All systems reviewed and negative except where noted in HPI.     Physical Exam:    Wt Readings from Last 3 Encounters:  03/09/23 100 lb 8 oz (45.6 kg)  09/04/22 103 lb (46.7 kg)  06/27/21 100 lb 6.4 oz (45.5 kg)    BP 94/60 (BP Location: Left Arm, Patient Position: Sitting, Cuff Size: Normal)   Pulse 76   Ht 4' 10 (1.473 m)   Wt 100 lb 8 oz (45.6 kg)   BMI 21.00 kg/m  Constitutional:  Pleasant, in no acute distress. Psychiatric: Normal mood and affect. Behavior is normal. EENT: Pupils normal.  Conjunctivae are normal. No scleral icterus. Neck: Supple. No cervical LAD.  Well-healed surgical incision scar on anterior neck.  No palpable foreign body, nodule. Cardiovascular: Normal rate, regular rhythm. No edema Pulmonary/chest: Effort normal and breath sounds normal. No wheezing, rales or rhonchi. Abdominal: Soft, nondistended, nontender. Bowel sounds active throughout. There are no masses palpable. No hepatomegaly. Neurological: Alert and oriented to person place and time. Skin: Skin is warm and dry. No rashes noted. Rectal exam: Sensation intact and preserved anal wink. No external anal fissures,  external hemorrhoids.  Small external skin tags.  Slight area of skin excoriation and localized mucosal breakdown.  Normal sphincter tone. No palpable mass. No blood on the exam glove.  Anoscopy with grade 2 internal hemorrhoids in RP and LL position.  (Chaperone: Rovonda, CMA).     ASSESSMENT AND PLAN;   1) Internal hemorrhoids 2) Pruritus ani Symptomatic internal hemorrhoids.  Colonoscopy by Dr. Kristie in 2021 was otherwise unremarkable (diverticulosis).  Discussed role/utility of repeat colonoscopy now.  Symptoms otherwise always seem consistent with benign anorectal etiology, so she does not wish to pursue colonoscopy at this juncture.  Does have some - Start fiber supplement - Desitin barrier cream - If symptoms persist, plan for hemorrhoid banding  3) Loose stool Longstanding history of intermittent loose stools.  Not diarrhea and no increase in stool urgency or frequency.  No hematochezia or other alarm features. - Start fiber supplement - Increase dietary fiber - If symptoms persist, can again discuss role/utility of colonoscopy or further eval patient with stool studies, labs, etc.  4) Globus sensation Unclear if this is related to her previous neck surgery.  Interesting that the symptoms only occur when trying to swallow while in left neck flexion.  Does not occur when neck in neutral or right flexed position. - Barium esophagram - If unrevealing, can consider conservative management vs CT neck vs repeat EGD     Sandor LULLA Flatter, DO, FACG  03/09/2023, 11:01 AM   Jason Leita Repine,*

## 2023-03-22 ENCOUNTER — Ambulatory Visit (HOSPITAL_COMMUNITY)
Admission: RE | Admit: 2023-03-22 | Discharge: 2023-03-22 | Disposition: A | Discharge status: Home / Self Care | Payer: No Typology Code available for payment source | Source: Ambulatory Visit | Attending: Gastroenterology | Admitting: Gastroenterology

## 2023-03-22 DIAGNOSIS — R09A2 Foreign body sensation, throat: Secondary | ICD-10-CM

## 2023-04-16 ENCOUNTER — Ambulatory Visit: Payer: No Typology Code available for payment source | Admitting: Gastroenterology

## 2023-04-16 ENCOUNTER — Encounter: Payer: Self-pay | Admitting: Gastroenterology

## 2023-04-16 VITALS — BP 120/62 | HR 65 | Ht <= 58 in | Wt 104.0 lb

## 2023-04-16 DIAGNOSIS — K641 Second degree hemorrhoids: Secondary | ICD-10-CM

## 2023-04-16 NOTE — Patient Instructions (Signed)
 _______________________________________________________  If your blood pressure at your visit was 140/90 or greater, please contact your primary care physician to follow up on this.  If you are age 54 or younger, your body mass index should be between 19-25. Your Body mass index is 21.74 kg/m. If this is out of the aformentioned range listed, please consider follow up with your Primary Care Provider.  ________________________________________________________  The [place] [facility] providers would like to encourage you to use [facility] to communicate with providers for non-urgent requests or questions.  Due to long hold times on the telephone, sending your provider a message by [facility] may be a faster and more efficient way to get a response.  Please allow 48 business hours for a response.  Please remember that this is for non-urgent requests.  _______________________________________________________  [place] PROCEDURE    FOLLOW-UP CARE   The procedure you have had should have been relatively painless since the banding of the area involved does not have nerve endings and there is no pain sensation.  The rubber band cuts off the blood supply to the hemorrhoid and the band may fall off as soon as 48 hours after the banding (the band may occasionally be seen in the toilet bowl following a bowel movement). You may notice a temporary feeling of fullness in the rectum which should respond adequately to plain Tylenol or Motrin.  Following the banding, avoid strenuous exercise that evening and resume full activity the next day.  A sitz bath (soaking in a warm tub) or bidet is soothing, and can be useful for cleansing the area after bowel movements.     To avoid constipation, take two tablespoons of natural wheat bran, natural oat bran, flax, Benefiber or any over the counter fiber supplement and increase your water intake to 7-8 glasses daily.    Unless you have been prescribed anorectal medication,  do not put anything inside your rectum for two weeks: No suppositories, enemas, fingers, etc.  Occasionally, you may have more bleeding than usual after the banding procedure.  This is often from the untreated hemorrhoids rather than the treated one.  Don't be concerned if there is a tablespoon or so of blood.  If there is more blood than this, lie flat with your bottom higher than your head and apply an ice pack to the area. If the bleeding does not stop within a half an hour or if you feel faint, call our office at [phone] or go to the emergency room.  Problems are not common; however, if there is a substantial amount of bleeding, severe pain, chills, fever or difficulty passing urine (very rare) or other problems, you should call us at [phone] or report to the nearest emergency room.  Do not stay seated continuously for more than 2-3 hours for a day or two after the procedure.  Tighten your buttock muscles 10-15 times every two hours and take 10-15 deep breaths every 1-2 hours.  Do not spend more than a few minutes on the toilet if you cannot empty your bowel; instead re-visit the toilet at a later time.  It was a pleasure to see you today!  Vito Cirigliano, D.O.

## 2023-04-16 NOTE — Progress Notes (Signed)
 Chief Complaint:    Symptomatic Internal Hemorrhoids; Hemorrhoid Band Ligation  GI History: 53 year old female follows in the GI clinic for symptomatic hemorrhoids.  Predominantly rectal itching.  Did have improvement with hydrocortisone cream in the past, but recurrence of symptoms after completion of topical therapy.  Does have scant BRB on tissue paper at times which she had attributed to increased pruritus ani and excoriation.  No change in bowel habits. Stools tend to be loose, and has been that way for years. 1-2 BM/day. Sxs actually improve when traveling or eating out at restaurants.   Separately, reports a feeling that something is stuck in her neck when swallowing.  Only occurs when head tilted to left and swallowing. Not when in neutral or right flexion. No odynophagia. No heartburn, regurgitation. Present for 1 year. Had neck surgery ~18 years ago in [place] for removal of what she reports was a benign growth. - 03/22/2023: Barium esophagram: Normal   Endoscopic Hx: - 12/27/2019: EGD (Dr. Loreta Ave): Esophagitis, otherwise normal.  Prescribed protonix 40 mg daily - 12/27/2019: Colonoscopy (Dr. Loreta Ave): Diverticulosis, internal hemorrhoids. Norml TI. Repeat 10 years   HPI:     Patient is a 53 y.o. female with a history of symptomatic internal hemorrhoids presenting to the Gastroenterology Clinic for follow-up and ongoing treatment. The patient presents with symptomatic grade 2 hemorrhoids, unresponsive to maximal medical therapy, requesting rubber band ligation of symptomatic hemorrhoidal disease.  No change in medical or surgical history, medications, allergies, social history since last appointment with me.   Review of systems:     No chest pain, no SOB, no fevers, no urinary sx   Past Medical History:  Diagnosis Date   H/O neck surgery    to remove a lump on left side of neck   Vaginal Pap smear, abnormal     Patient's surgical history, family medical history, social history,  medications and allergies were all reviewed in Epic    Current Outpatient Medications  Medication Sig Dispense Refill   Cholecalciferol (VITAMIN D-3 PO) Take 1 capsule by mouth daily.     hydrocortisone (ANUSOL-HC) 2.5 % rectal cream Place 1 Application rectally 2 (two) times daily. 30 g 0   No current facility-administered medications for this visit.    Physical Exam:     BP 120/62   Pulse 65   Ht 4\' 10"  (1.473 m)   Wt 104 lb (47.2 kg)   BMI 21.74 kg/m   GENERAL:  Pleasant female in NAD PSYCH: : Cooperative, normal affect NEURO: Alert and oriented x 3, no focal neurologic deficits Rectal exam: Sensation intact and preserved anal wink. Small external skin tags.  Grade 2 hemorrhoids noted in RP and LL positions on exam.  No external anal fissures noted. Normal sphincter tone. No palpable mass. No blood on the exam glove. (Chaperone: Thompson Grayer, CMA).   IMPRESSION and PLAN:    #1.  Symptomatic internal hemorrhoids: PROCEDURE NOTE: The patient presents with symptomatic grade 2 hemorrhoids, unresponsive to maximal medical therapy, requesting rubber band ligation of symptomatic hemorrhoidal disease.  All risks, benefits and alternative forms of therapy were described and informed consent was obtained.  In the Left Lateral Decubitus position, anoscopic examination revealed grade 2 hemorrhoids in the RP and LL position(s).  The anorectum was pre-medicated with RectiCare. The decision was made to band the RP internal hemorrhoid, and the [facility] O'Regan System was used to perform band ligation without complication.  Digital anorectal examination was then performed to assure proper positioning  of the band, and to adjust the banded tissue as required.  The patient was discharged home without pain or other issues.  Dietary and behavioral recommendations were given and along with follow-up instructions.     The following adjunctive treatments were recommended:  -Resume high-fiber diet with  fiber supplement (i.e. Citrucel or Benefiber) with goal for soft stools without straining to have a BM. -Resume adequate fluid intake.  The patient will return in 4+ weeks for follow-up and possible additional banding as required. No complications were encountered and the patient tolerated the procedure well.    Shellia Cleverly ,DO, FACG 04/16/2023, 10:39 AM

## 2023-05-14 ENCOUNTER — Ambulatory Visit: Payer: No Typology Code available for payment source | Admitting: Gastroenterology

## 2023-05-14 ENCOUNTER — Encounter: Payer: Self-pay | Admitting: Gastroenterology

## 2023-05-14 VITALS — BP 112/62 | HR 68 | Ht <= 58 in | Wt 104.6 lb

## 2023-05-14 DIAGNOSIS — K641 Second degree hemorrhoids: Secondary | ICD-10-CM | POA: Diagnosis not present

## 2023-05-14 NOTE — Progress Notes (Signed)
 Chief Complaint:    Symptomatic Internal Hemorrhoids; Hemorrhoid Band Ligation  GI History:  53 year old female follows in the GI clinic for symptomatic hemorrhoids.  Predominantly rectal itching.  Did have improvement with hydrocortisone cream in the past, but recurrence of symptoms after completion of topical therapy.  Does have scant BRB on tissue paper at times which she had attributed to increased pruritus ani and excoriation.  No change in bowel habits. Stools tend to be loose, and has been that way for years. 1-2 BM/day. Sxs actually improve when traveling or eating out at restaurants.   - 04/16/2023: Banding of RP hemorrhoid   Separately, reports a feeling that something is stuck in her neck when swallowing.  Only occurs when head tilted to left and swallowing. Not when in neutral or right flexion. No odynophagia. No heartburn, regurgitation. Present for 1 year. Had neck surgery ~18 years ago in [place] for removal of what she reports was a benign growth. - 03/22/2023: Barium esophagram: Normal   Endoscopic Hx: - 12/27/2019: EGD (Dr. Loreta Ave): Esophagitis, otherwise normal.  Prescribed protonix 40 mg daily - 12/27/2019: Colonoscopy (Dr. Loreta Ave): Diverticulosis, internal hemorrhoids. Norml TI. Repeat 10 years   HPI:     Patient is a 53 y.o. femalewith a history of symptomatic internal hemorrhoids presenting to the Gastroenterology Clinic for follow-up and ongoing treatment. The patient presents with symptomatic grade 2 hemorrhoids, unresponsive to maximal medical therapy, requesting rubber band ligation of symptomatic hemorrhoidal disease.  Decreased perianal itching since first hemorrhoid banding.  Tolerated procedure without issue.  No change in medical or surgical history, medications, allergies, social history since last appointment with me.   Review of systems:     No chest pain, no SOB, no fevers, no urinary sx   Past Medical History:  Diagnosis Date   H/O neck surgery    to  remove a lump on left side of neck   Vaginal Pap smear, abnormal     Patient's surgical history, family medical history, social history, medications and allergies were all reviewed in Epic    Current Outpatient Medications  Medication Sig Dispense Refill   Cholecalciferol (VITAMIN D-3 PO) Take 1 capsule by mouth daily.     diphenhydrAMINE HCl (BENADRYL ALLERGY PO) Take 1 tablet by mouth as needed.     Ginkgo Biloba (GINKOBA) 40 MG TABS Take 1 tablet by mouth. Taking daily for 2 weeks, THEN 1 week off, REPEAT.     hydrocortisone (ANUSOL-HC) 2.5 % rectal cream Place 1 Application rectally 2 (two) times daily. (Patient not taking: Reported on 05/14/2023) 30 g 0   No current facility-administered medications for this visit.    Physical Exam:     BP 112/62   Pulse 68   Ht 4\' 10"  (1.473 m)   Wt 104 lb 9.6 oz (47.4 kg)   BMI 21.86 kg/m   GENERAL:  Pleasant female in NAD PSYCH: : Cooperative, normal affect NEURO: Alert and oriented x 3, no focal neurologic deficits Rectal exam: Sensation intact and preserved anal wink.  Small grade 2 hemorrhoids noted in LL position on exam.   Normal sphincter tone. No palpable mass. No blood on the exam glove. (Chaperone: Thompson Grayer, CMA).   IMPRESSION and PLAN:    #1.  Symptomatic internal hemorrhoids: PROCEDURE NOTE: The patient presents with symptomatic grade 2 hemorrhoids, unresponsive to maximal medical therapy, requesting rubber band ligation of symptomatic hemorrhoidal disease.  All risks, benefits and alternative forms of therapy were described and informed consent was  obtained.  In the Left Lateral Decubitus position, anoscopic examination revealed grade 2 hemorrhoids in the LL position(s).  The anorectum was pre-medicated with RectiCare. The decision was made to band the LL internal hemorrhoid, and the [facility] O'Regan System was used to perform band ligation without complication.  Digital anorectal examination was then performed to assure  proper positioning of the band, and to adjust the banded tissue as required.  The patient was discharged home without pain or other issues.  Dietary and behavioral recommendations were given and along with follow-up instructions.    The following adjunctive treatments were recommended:  -Resume high-fiber diet with fiber supplement (i.e. Citrucel or Benefiber) with goal for soft stools without straining to have a BM. -Resume adequate fluid intake.  The patient will return as needed if any return of hemorrhoidal symptoms for reevaluation and possible additional banding as required. No complications were encountered and the patient tolerated the procedure well.     RTC as needed      Shellia Cleverly ,DO, FACG 05/14/2023, 4:10 PM

## 2023-05-14 NOTE — Patient Instructions (Signed)
 HEMORRHOID BANDING PROCEDURE    FOLLOW-UP CARE   The procedure you have had should have been relatively painless since the banding of the area involved does not have nerve endings and there is no pain sensation.  The rubber band cuts off the blood supply to the hemorrhoid and the band may fall off as soon as 48 hours after the banding (the band may occasionally be seen in the toilet bowl following a bowel movement). You may notice a temporary feeling of fullness in the rectum which should respond adequately to plain Tylenol or Motrin.  Following the banding, avoid strenuous exercise that evening and resume full activity the next day.  A sitz bath (soaking in a warm tub) or bidet is soothing, and can be useful for cleansing the area after bowel movements.     To avoid constipation, take two tablespoons of natural wheat bran, natural oat bran, flax, Benefiber or any over the counter fiber supplement and increase your water intake to 7-8 glasses daily.    Unless you have been prescribed anorectal medication, do not put anything inside your rectum for two weeks: No suppositories, enemas, fingers, etc.  Occasionally, you may have more bleeding than usual after the banding procedure.  This is often from the untreated hemorrhoids rather than the treated one.  Don't be concerned if there is a tablespoon or so of blood.  If there is more blood than this, lie flat with your bottom higher than your head and apply an ice pack to the area. If the bleeding does not stop within a half an hour or if you feel faint, call our office at [phone] or go to the emergency room.  Problems are not common; however, if there is a substantial amount of bleeding, severe pain, chills, fever or difficulty passing urine (very rare) or other problems, you should call us at [phone] or report to the nearest emergency room.  Do not stay seated continuously for more than 2-3 hours for a day or two after the procedure.   Tighten your buttock muscles 10-15 times every two hours and take 10-15 deep breaths every 1-2 hours.  Do not spend more than a few minutes on the toilet if you cannot empty your bowel; instead re-visit the toilet at a later time.   It was a pleasure to see you today!  Vito Cirigliano, D.O.

## 2023-11-02 ENCOUNTER — Ambulatory Visit: Payer: Self-pay

## 2023-11-02 NOTE — Telephone Encounter (Signed)
 FYI Only or Action Required?: FYI only for provider.  Patient was last seen in primary care on 09/04/2022 by Jason Leita Repine, FNP.  Called Nurse Triage reporting Abdominal Pain.  Symptoms began several months ago.  Interventions attempted: Rest, hydration, or home remedies.  Symptoms are: unchanged.  Triage Disposition: See PCP Within 2 Weeks  Patient/caregiver understands and will follow disposition?: No   Copied from CRM [id number]. Topic: Clinical - Red Word Triage >> Nov 02, 2023  4:37 PM Harlene ORN wrote: Red Word that prompted transfer to Nurse Triage: something is moving in her belly  painful Reason for Disposition  Abdominal pain is a chronic symptom (recurrent or ongoing AND present > 4 weeks)  Answer Assessment - Initial Assessment Questions Additional info: Patient was previous patient of Leita Jason, she has not been assigned a new pcp, patient was not aware pcp left practice, she is requesting soonest available TOC with a doctor, scheduled next available on 05/02/24, she is requesting her physical this date. Added TOC appointment to wait list. Patient will proceed to urgent care for abdominal pain and diarrhea.   1. LOCATION: Where does it hurt?      All over feel something moving 2. RADIATION: Does the pain shoot anywhere else? (e.g., chest, back)     no 3. ONSET: When did the pain begin? (e.g., minutes, hours or days ago)      6-10 months  4. SUDDEN: Gradual or sudden onset?     gradual 5. PATTERN Does the pain come and go, or is it constant?     Intermittent-relieved with BM 6. SEVERITY: How bad is the pain?  (e.g., Scale 1-10; mild, moderate, or severe)     mild 7. RECURRENT SYMPTOM: Have you ever had this type of stomach pain before? If Yes, ask: When was the last time? and What happened that time?      yes 8. CAUSE: What do you think is causing the stomach pain? (e.g., gallstones, recent abdominal surgery)     unsure 9.  RELIEVING/AGGRAVATING FACTORS: What makes it better or worse? (e.g., antacids, bending or twisting motion, bowel movement)     Bowel movements relieve pain 10. OTHER SYMPTOMS: Do you have any other symptoms? (e.g., back pain, diarrhea, fever, urination pain, vomiting)       Intermittent liquid diarrhea. Denies all other symptoms.  Protocols used: Abdominal Pain - Female-A-AH

## 2023-12-16 ENCOUNTER — Ambulatory Visit: Admitting: Physician Assistant

## 2024-01-13 ENCOUNTER — Ambulatory Visit: Admitting: Gastroenterology

## 2024-01-13 ENCOUNTER — Encounter: Payer: Self-pay | Admitting: Gastroenterology

## 2024-01-13 VITALS — BP 112/70 | HR 68 | Ht <= 58 in | Wt 104.0 lb

## 2024-01-13 DIAGNOSIS — K602 Anal fissure, unspecified: Secondary | ICD-10-CM | POA: Diagnosis not present

## 2024-01-13 DIAGNOSIS — L290 Pruritus ani: Secondary | ICD-10-CM | POA: Diagnosis not present

## 2024-01-13 DIAGNOSIS — K648 Other hemorrhoids: Secondary | ICD-10-CM | POA: Diagnosis not present

## 2024-01-13 MED ORDER — AMBULATORY NON FORMULARY MEDICATION: 0 refills | Status: AC

## 2024-01-13 NOTE — Progress Notes (Signed)
 Chief Complaint:   Pruritus ani, symptomatic hemorrhoids  GI History: 53 year old female follows in the GI clinic for symptomatic hemorrhoids.  Predominantly rectal itching.  Did have improvement with hydrocortisone  cream in the past, but recurrence of symptoms after completion of topical therapy.  Does have scant BRB on tissue paper at times which she had attributed to increased pruritus ani and excoriation.  No change in bowel habits. Stools tend to be loose, and has been that way for years. 1-2 BM/day. Sxs actually improve when traveling or eating out at restaurants.    - 04/16/2023: Banding of RP hemorrhoid - 05/14/2023: Banding of LL hemorrhoid   Separately, reports a feeling that something is stuck in her neck when swallowing.  Only occurs when head tilted to left and swallowing. Not when in neutral or right flexion. No odynophagia. No heartburn, regurgitation. Present for 1 year. Had neck surgery ~18 years ago in [place] for removal of what she reports was a benign growth. - 03/22/2023: Barium esophagram: Normal   Endoscopic Hx: - 12/27/2019: EGD (Dr. Kristie): Esophagitis, otherwise normal.  Prescribed protonix 40 mg daily - 12/27/2019: Colonoscopy (Dr. Kristie): Diverticulosis, internal hemorrhoids. Norml TI. Repeat 10 years   HPI:     Patient is a 53 y.o. female presenting to the Gastroenterology Clinic for follow-up.  Was last seen by me on 05/14/2023 for symptomatic hemorrhoids (pruritus ani).  Completed hemorrhoid banding as above.  Had improvement in itching after 2nd banding, but since then has slowly had recurrence of pruritus ani. Has used Desitin cream with improvement when she uses it, but recurrence of sxs.  Rare scant BRB on tissue paper.  Rare episodes of constipation and straining of BM.   Review of systems:     No chest pain, no SOB, no fevers, no urinary sx   Past Medical History:  Diagnosis Date   H/O neck surgery    to remove a lump on left side of neck   Vaginal Pap  smear, abnormal     Patient's surgical history, family medical history, social history, medications and allergies were all reviewed in Epic    Current Outpatient Medications  Medication Sig Dispense Refill   Cholecalciferol (VITAMIN D -3 PO) Take 1 capsule by mouth daily.     diphenhydrAMINE HCl (BENADRYL ALLERGY PO) Take 1 tablet by mouth as needed.     Ginkgo Biloba (GINKOBA) 40 MG TABS Take 1 tablet by mouth. Taking daily for 2 weeks, THEN 1 week off, REPEAT.     hydrocortisone  (ANUSOL -HC) 2.5 % rectal cream Place 1 Application rectally 2 (two) times daily. (Patient not taking: Reported on 01/13/2024) 30 g 0   No current facility-administered medications for this visit.    Physical Exam:     BP 112/70   Pulse 68   Ht 4' 10 (1.473 m)   Wt 104 lb (47.2 kg)   BMI 21.74 kg/m   GENERAL:  Pleasant female in NAD PSYCH: : Cooperative, normal affect NEURO: Alert and oriented x 3, no focal neurologic deficits Rectal exam: Sensation intact and preserved anal wink. No external anal fissures, external hemorrhoids or skin tags. Normal sphincter tone.  On anoscopy, internal anal fissure in posterior anal canal with exquisite TTP. (Chaperone: Nat Sic, CMA).     IMPRESSION and PLAN:    1) Anal Fissure 2) Pruritus ani Physical exam notable for external anal fissure in the 9:00 position (patient in left lateral position). Will treat as below:  - Start topical NTG 0.125%, apply  a small, pea-sized amount to the affected area BID for 6-8 weeks.  - We discussed the ADR of headache, and if patient does experience, to call me and will change and make pharmacy request to compound topical CCB (topical nifedipine 0.2-0.3% applied 2-4 times daily; unfortunately, the CCB also carries an ADR of headache in 5-12%). Additionally, cautioned to avoid strenuous activity within 30 minutes of application  - Start OTC stool softener for a goal of regular, soft, stools without straining to have a bowel  movement to improve stool consistency and aid in fissure healing  - Sitz bath with warm water for 10-15 minutes 2-3 times daily; directed to [employer] available online or at [employer]; ensure to dry area afterwards - To f/u in the clinic in 3 months or sooner prn  3) Internal hemorrhoids Clinical improvement with hemorrhoid banding and no hemorrhoids noted on exam or limited anoscopy today.  No role for repeat banding today.           Sandor GAILS Shadman Tozzi ,DO, FACG 01/13/2024, 11:12 AM

## 2024-01-13 NOTE — Patient Instructions (Signed)
 _______________________________________________________  If your blood pressure at your visit was 140/90 or greater, please contact your primary care physician to follow up on this.  _______________________________________________________  If you are age 54 or older, your body mass index should be between 23-30. Your Body mass index is 21.74 kg/m. If this is out of the aforementioned range listed, please consider follow up with your Primary Care Provider.  If you are age 67 or younger, your body mass index should be between 19-25. Your Body mass index is 21.74 kg/m. If this is out of the aformentioned range listed, please consider follow up with your Primary Care Provider.   ________________________________________________________  The [facility] providers would like to encourage you to use MYCHART to communicate with providers for non-urgent requests or questions.  Due to long hold times on the telephone, sending your provider a message by [facility] may be a faster and more efficient way to get a response.  Please allow 48 business hours for a response.  Please remember that this is for non-urgent requests.  _______________________________________________________  [facility] is using a team-based approach to care.  Your team is made up of your doctor and two to three APPS. Our APPS (Nurse Practitioners and Physician Assistants) work with your physician to ensure care continuity for you. They are fully qualified to address your health concerns and develop a treatment plan. They communicate directly with your gastroenterologist to care for you. Seeing the Advanced Practice Practitioners on your physician's team can help you by facilitating care more promptly, often allowing for earlier appointments, access to diagnostic testing, procedures, and other specialty referrals.   Please purchase the following medications over the counter and take as directed:  START: Colace 100mg  daily  We  have sent a prescription for nitroglycerin 0.125% gel to [facility]. You should apply a pea size amount to your rectum two times daily x 6-8 weeks.  [facility]'s information is below: Address: [address], [place], [place] [address]  [phone]  *Please DO NOT go directly from our office to pick up this medication! Give the pharmacy 1 day to process the prescription as this is compounded and takes time to make.  How to Take a Sitz Bath A sitz bath is a warm water bath that may be used to care for your rectum, genital area, or the area between your rectum and genitals (perineum). In a sitz bath, the water only comes up to your hips and covers your buttocks. A sitz bath may be done in a bathtub or with a portable sitz bath that fits over the toilet. Your health care provider may recommend a sitz bath to help: Relieve pain and discomfort after delivering a baby. Relieve pain and itching from hemorrhoids or anal fissures. Relieve pain after certain surgeries. Relax muscles that are sore or tight. How to take a sitz bath Take 2-4 sitz baths a day, or as many as told by your health care provider. Bathtub sitz bath   To take a sitz bath in a bathtub: Partially fill a bathtub with warm water. The water should be deep enough to cover your hips and buttocks when you are sitting in the bathtub. Follow your health care provider's instructions if you are told to put medicine in the water. Sit in the water. Open the bathtub drain a little, and leave it open during your bath. Turn on the warm water again, enough to replace the water that is draining out. Keep the water  running throughout your bath. This helps keep the water at the right level and temperature. Soak in the water for 15-20 minutes, or as long as told by your health care provider. When you are done, be careful when you stand up. You may feel dizzy. After the sitz bath, pat yourself dry. Do not rub your skin to  dry it.  Over-the-toilet sitz bath To take a sitz bath with an over-the-toilet basin: Follow the manufacturer's instructions. Fill the basin with warm water. Follow your health care provider's instructions if you were told to put medicine in the water. Sit on the seat. Make sure the water covers your buttocks and perineum. Soak in the water for 15-20 minutes, or as long as told by your health care provider. After the sitz bath, pat yourself dry. Do not rub your skin to dry it. Clean and dry the basin between uses. Discard the basin if it cracks, or according to the manufacturer's instructions.  Contact a health care provider if: Your pain or itching gets worse. Stop doing sitz baths if your symptoms get worse. You have new symptoms. Stop doing sitz baths until you talk with your health care provider. Summary A sitz bath is a warm water bath in which the water only comes up to your hips and covers your buttocks. Your health care provider may recommend a sitz bath to help relieve pain and discomfort after delivering a baby, relieve pain and itching from hemorrhoids or anal fissures, relieve pain after certain surgeries, or help to relax muscles that are sore or tight. Take 2-4 sitz baths a day, or as many as told by your health care provider. Soak in the water for 15-20 minutes. Stop doing sitz baths if your symptoms get worse. This information is not intended to replace advice given to you by your health care provider. Make sure you discuss any questions you have with your health care provider. Document Revised: 04/22/2021 Document Reviewed: 04/22/2021 Elsevier Patient Education  2024 [employer].  It was a pleasure to see you today!  Vito Cirigliano, D.O.

## 2024-05-02 ENCOUNTER — Encounter: Admitting: Family Medicine
